# Patient Record
Sex: Male | Born: 1985
Health system: Southern US, Community
[De-identification: ages and names within clinical notes are randomized; demographics above are authoritative.]

---
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
## 2006-09-12 IMAGING — CR DG CHEST 2V
2 series · 2 of 2 positions shown · non-contrast
Comparison: None.

CLINICAL DATA: Cough with wheezing for several weeks. 
 CHEST, TWO VIEWS:

[view not recorded (1 of 2)]
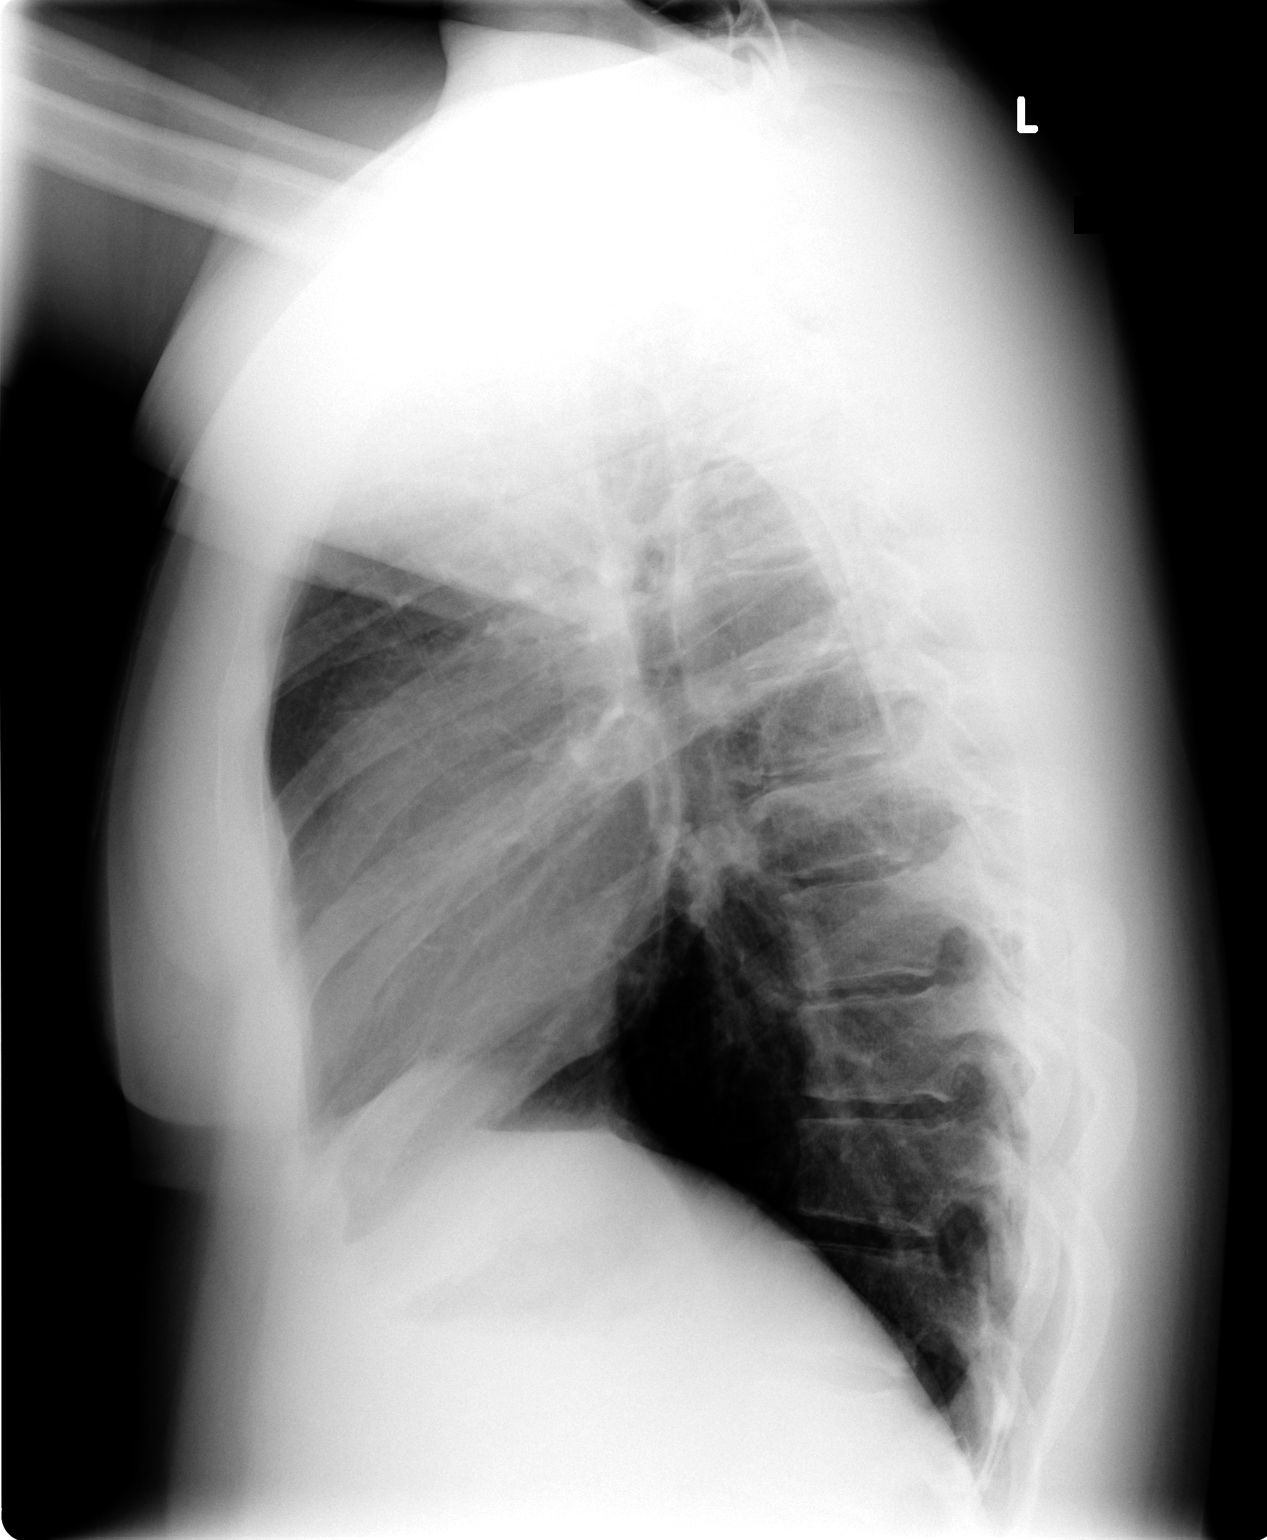

[view not recorded (2 of 2)]
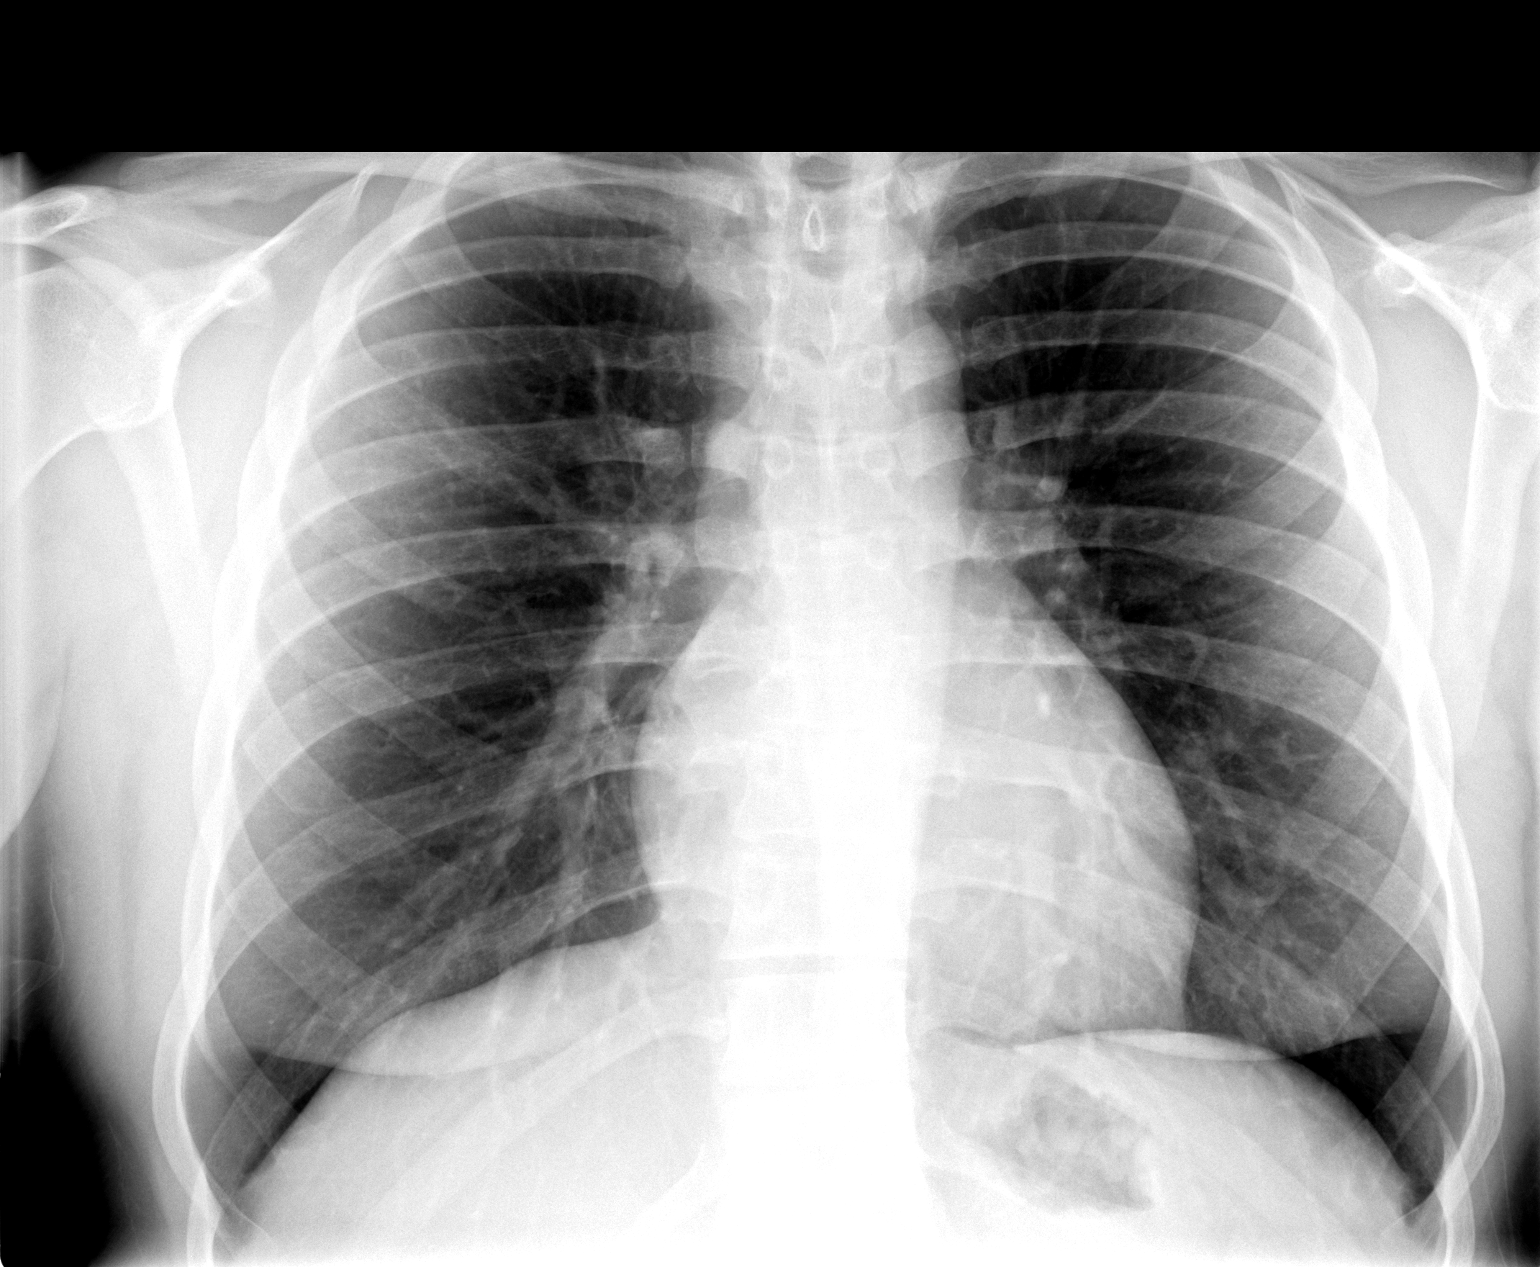

[2 of 2 positions shown; findings below may reference images not displayed]

Heart and vascularity normal.  The lungs are hyperaerated but clear.  No significant peribronchial thickening.
IMPRESSION: Lungs are hyperaerated but no active disease.

## 2010-07-17 ENCOUNTER — Ambulatory Visit: Payer: Self-pay | Admitting: Internal Medicine

## 2010-09-19 ENCOUNTER — Ambulatory Visit: Payer: Self-pay | Admitting: Internal Medicine

## 2011-08-31 ENCOUNTER — Encounter: Payer: Self-pay | Admitting: Internal Medicine

## 2011-08-31 ENCOUNTER — Ambulatory Visit (INDEPENDENT_AMBULATORY_CARE_PROVIDER_SITE_OTHER): Payer: BC Managed Care – PPO | Admitting: Internal Medicine

## 2011-08-31 VITALS — BP 124/80 | HR 68 | Temp 97.7°F | Ht 72.0 in | Wt 225.0 lb

## 2011-08-31 DIAGNOSIS — T7840XA Allergy, unspecified, initial encounter: Secondary | ICD-10-CM

## 2011-08-31 DIAGNOSIS — Z91040 Latex allergy status: Secondary | ICD-10-CM

## 2011-08-31 DIAGNOSIS — Z23 Encounter for immunization: Secondary | ICD-10-CM

## 2011-08-31 DIAGNOSIS — F988 Other specified behavioral and emotional disorders with onset usually occurring in childhood and adolescence: Secondary | ICD-10-CM

## 2011-08-31 NOTE — Patient Instructions (Signed)
Use EpiPen as directed if needed for latex allergy

## 2011-08-31 NOTE — Progress Notes (Signed)
  Subjective:    Patient ID: Alex Wells, male    DOB: 09-01-1986, 25 y.o.   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 Has a history of latex allergy and needs EpiPen refilled. Also has a history of attention deficit disorder. Not currently on attention deficit disorder medication. Also requests influenza immunization  which was given today.    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 Uses nitrile gloves now  Refill EpiPen when necessary one year. Influenza immunization given today

## 2011-09-01 DIAGNOSIS — Z23 Encounter for immunization: Secondary | ICD-10-CM

## 2012-09-05 ENCOUNTER — Encounter: Payer: Self-pay | Admitting: Internal Medicine

## 2012-09-05 ENCOUNTER — Ambulatory Visit (INDEPENDENT_AMBULATORY_CARE_PROVIDER_SITE_OTHER): Payer: Self-pay | Admitting: Internal Medicine

## 2012-09-05 VITALS — BP 126/70 | HR 80 | Temp 97.5°F | Wt 210.0 lb

## 2012-09-05 DIAGNOSIS — A088 Other specified intestinal infections: Secondary | ICD-10-CM

## 2012-09-05 DIAGNOSIS — A084 Viral intestinal infection, unspecified: Secondary | ICD-10-CM

## 2012-09-05 NOTE — Patient Instructions (Addendum)
Clear liquids until diarrhea has resolved for 24 hours then advance to soft diet. Stay out of work as long as you have diarrhea as you are likely contagious with viral gastroenteritis. Return as needed. Stay well hydrated.

## 2012-09-05 NOTE — Progress Notes (Signed)
  Subjective:    Patient ID: Alex Wells, male    DOB: 26-Dec-1985, 26 y.o.   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 No vomiting. Says he picked up a patient who had diarrhea and vomiting last week transport to [facility] He became ill the following day. His partner became ill a day or so later with similar illness. No travel history. Has not eaten anything unusual. No fever. No chills or myalgias.   Review of Systems     Objective:   Physical Exam abdominal exam no hepatosplenomegaly masses or tenderness        Assessment & Plan:  Probable viral gastroenteritis  Plan: Clear liquids until diarrhea resolves then advance diet slowly. Note given to be out of work. He tried Imodium but said it did not control symptoms. This is likely Norovirus.

## 2014-04-17 ENCOUNTER — Emergency Department (HOSPITAL_BASED_OUTPATIENT_CLINIC_OR_DEPARTMENT_OTHER)
Admission: EM | Admit: 2014-04-17 | Discharge: 2014-04-17 | Disposition: A | Discharge status: Home / Self Care | Payer: BC Managed Care – PPO | Attending: Emergency Medicine | Admitting: Emergency Medicine

## 2014-04-17 ENCOUNTER — Encounter (HOSPITAL_BASED_OUTPATIENT_CLINIC_OR_DEPARTMENT_OTHER): Payer: Self-pay | Admitting: Emergency Medicine

## 2014-04-17 DIAGNOSIS — S01501A Unspecified open wound of lip, initial encounter: Secondary | ICD-10-CM | POA: Insufficient documentation

## 2014-04-17 DIAGNOSIS — Y9389 Activity, other specified: Secondary | ICD-10-CM | POA: Insufficient documentation

## 2014-04-17 DIAGNOSIS — Y929 Unspecified place or not applicable: Secondary | ICD-10-CM | POA: Insufficient documentation

## 2014-04-17 DIAGNOSIS — S0180XA Unspecified open wound of other part of head, initial encounter: Secondary | ICD-10-CM | POA: Insufficient documentation

## 2014-04-17 DIAGNOSIS — F17200 Nicotine dependence, unspecified, uncomplicated: Secondary | ICD-10-CM | POA: Insufficient documentation

## 2014-04-17 DIAGNOSIS — S01511A Laceration without foreign body of lip, initial encounter: Secondary | ICD-10-CM

## 2014-04-17 DIAGNOSIS — S0181XA Laceration without foreign body of other part of head, initial encounter: Secondary | ICD-10-CM

## 2014-04-17 DIAGNOSIS — W108XXA Fall (on) (from) other stairs and steps, initial encounter: Secondary | ICD-10-CM | POA: Insufficient documentation

## 2014-04-17 DIAGNOSIS — Z91040 Latex allergy status: Secondary | ICD-10-CM | POA: Insufficient documentation

## 2014-04-17 MED ORDER — LIDOCAINE-EPINEPHRINE-TETRACAINE (LET) SOLUTION
3.0000 mL | Freq: Once | NASAL | Status: AC
  Administered 2014-04-17: 3 mL via TOPICAL
  Filled 2014-04-17: qty 3

## 2014-04-17 MED ORDER — CEPHALEXIN 250 MG PO CAPS
500.0000 mg | ORAL_CAPSULE | Freq: Once | ORAL | Status: AC
  Administered 2014-04-17: 500 mg via ORAL
  Filled 2014-04-17: qty 2

## 2014-04-17 MED ORDER — CEPHALEXIN 500 MG PO CAPS: 500.0000 mg | ORAL_CAPSULE | Freq: Four times a day (QID) | ORAL | Status: DC

## 2014-04-17 MED ORDER — OXYCODONE-ACETAMINOPHEN 5-325 MG PO TABS
1.0000 | ORAL_TABLET | Freq: Once | ORAL | Status: DC
  Filled 2014-04-17: qty 1

## 2014-04-17 NOTE — Discharge Instructions (Signed)
Facial Laceration  A facial laceration is a cut on the face. These injuries can be painful and cause bleeding. Lacerations usually heal quickly, but they need special care to reduce scarring. DIAGNOSIS  Your health care provider will take a medical history, ask for details about how the injury occurred, and examine the wound to determine how deep the cut is. TREATMENT  Some facial lacerations may not require closure. Others may not be able to be closed because of an increased risk of infection. The risk of infection and the chance for successful closure will depend on various factors, including the amount of time since the injury occurred. The wound may be cleaned to help prevent infection. If closure is appropriate, pain medicines may be given if needed. Your health care provider will use stitches (sutures), wound glue (adhesive), or skin adhesive strips to repair the laceration. These tools bring the skin edges together to allow for faster healing and a better cosmetic outcome. If needed, you may also be given a tetanus shot. HOME CARE INSTRUCTIONS  Only take over-the-counter or prescription medicines as directed by your health care provider.  Follow your health care provider's instructions for wound care. These instructions will vary depending on the technique used for closing the wound. For Sutures:  Keep the wound clean and dry.   If you were given a bandage (dressing), you should change it at least once a day. Also change the dressing if it becomes wet or dirty, or as directed by your health care provider.   Wash the wound with soap and water 2 times a day. Rinse the wound off with water to remove all soap. Pat the wound dry with a clean towel.   After cleaning, apply a thin layer of the antibiotic ointment recommended by your health care provider. This will help prevent infection and keep the dressing from sticking.   You may shower as usual after the first 24 hours. Do not soak the  wound in water until the sutures are removed.   Get your sutures removed as directed by your health care provider. With facial lacerations, sutures should usually be taken out after 4-5 days to avoid stitch marks.   Wait a few days after your sutures are removed before applying any makeup. For Skin Adhesive Strips:  Keep the wound clean and dry.   Do not get the skin adhesive strips wet. You may bathe carefully, using caution to keep the wound dry.   If the wound gets wet, pat it dry with a clean towel.   Skin adhesive strips will fall off on their own. You may trim the strips as the wound heals. Do not remove skin adhesive strips that are still stuck to the wound. They will fall off in time.  For Wound Adhesive:  You may briefly wet your wound in the shower or bath. Do not soak or scrub the wound. Do not swim. Avoid periods of heavy sweating until the skin adhesive has fallen off on its own. After showering or bathing, gently pat the wound dry with a clean towel.   Do not apply liquid medicine, cream medicine, ointment medicine, or makeup to your wound while the skin adhesive is in place. This may loosen the film before your wound is healed.   If a dressing is placed over the wound, be careful not to apply tape directly over the skin adhesive. This may cause the adhesive to be pulled off before the wound is healed.   Avoid   prolonged exposure to sunlight or tanning lamps while the skin adhesive is in place.  The skin adhesive will usually remain in place for 5-10 days, then naturally fall off the skin. Do not pick at the adhesive film.  After Healing: Once the wound has healed, cover the wound with sunscreen during the day for 1 full year. This can help minimize scarring. Exposure to ultraviolet light in the first year will darken the scar. It can take 1-2 years for the scar to lose its redness and to heal completely.  SEEK IMMEDIATE MEDICAL CARE IF:  You have redness, pain, or  swelling around the wound.   You see ayellowish-white fluid (pus) coming from the wound.   You have chills or a fever.  MAKE SURE YOU:  Understand these instructions.  Will watch your condition.  Will get help right away if you are not doing well or get worse. Document Released: 11/05/2004 Document Revised: 07/19/2013 Document Reviewed: 05/11/2013 ExitCare Patient Information 2015 [employer]. This information is not intended to replace advice given to you by your health care provider. Make sure you discuss any questions you have with your health care provider.  

## 2014-04-17 NOTE — ED Notes (Signed)
Pt reports fell going up wooden stairs.  Sustained laceration to lip and chin.

## 2014-04-17 NOTE — ED Provider Notes (Signed)
CSN: [id number]     Arrival date & time 04/17/14  0100 History   First MD Initiated Contact with Patient 04/17/14 0138     Chief Complaint  Patient presents with  . Facial Laceration     (Consider location/radiation/quality/duration/timing/severity/associated sxs/prior Treatment) Patient is a 28 y.o. male presenting with scalp laceration. The history is provided by the patient.  Head Laceration This is a new problem. The current episode started 1 to 2 hours ago. The problem occurs constantly. The problem has not changed since onset.Pertinent negatives include no chest pain, no abdominal pain, no headaches and no shortness of breath. Nothing aggravates the symptoms. Nothing relieves the symptoms. He has tried nothing for the symptoms. The treatment provided no relief.  Hit face on wooden steps.  No LOC no vomiting no neck pain  History reviewed. No pertinent past medical history. History reviewed. No pertinent past surgical history. No family history on file. History  Substance Use Topics  . Smoking status: Current Some Day Smoker    Types: Cigars  . Smokeless tobacco: Never Used  . Alcohol Use: Yes     Comment: occasional    Review of Systems  Eyes: Negative for visual disturbance.  Respiratory: Negative for shortness of breath.   Cardiovascular: Negative for chest pain.  Gastrointestinal: Negative for abdominal pain.  Neurological: Negative for weakness, numbness and headaches.  All other systems reviewed and are negative.     Allergies  Latex  Home Medications   Prior to Admission medications   Medication Sig Start Date End Date Taking? Authorizing Provider  cephALEXin (KEFLEX) 500 MG capsule Take 1 capsule (500 mg total) by mouth 4 (four) times daily. 04/17/14   Aaryana Betke K Daniesha Driver-Rasch, MD  EPINEPHrine (EPI-PEN) 0.3 mg/0.3 mL DEVI Inject 0.3 mg into the muscle once.      Historical Provider, MD   BP 132/68  Pulse 90  Temp(Src) 98.5 F (36.9 C) (Oral)  Resp 16  Ht 6'  (1.829 m)  Wt 230 lb (104.327 kg)  BMI 31.19 kg/m2  SpO2 98% Physical Exam  Constitutional: He is oriented to person, place, and time. He appears well-developed and well-nourished.  HENT:  Head: Normocephalic. Head is without raccoon's eyes and without Battle's sign.    Right Ear: No mastoid tenderness. No hemotympanum.  Left Ear: No mastoid tenderness. No hemotympanum.  Mouth/Throat: Oropharynx is clear and moist.  Eyes: Conjunctivae are normal. Pupils are equal, round, and reactive to light.  Neck: Normal range of motion. Neck supple.  No c spine tenderness  Cardiovascular: Normal rate, regular rhythm and intact distal pulses.   Pulmonary/Chest: Effort normal and breath sounds normal. He has no wheezes. He has no rales.  Abdominal: Soft. Bowel sounds are normal. There is no tenderness. There is no rebound and no guarding.  Musculoskeletal: Normal range of motion.  Neurological: He is alert and oriented to person, place, and time.  Skin: Skin is warm and dry.  Psychiatric: He has a normal mood and affect.    ED Course  Procedures (including critical care time) Labs Review Labs Reviewed - No data to display  Imaging Review No results found.   EKG Interpretation None      MDM   Final diagnoses:  Facial laceration, initial encounter  Chin laceration, initial encounter  Lip laceration, initial encounter    LACERATION REPAIR Performed by: Jasmine AwePALUMBO-RASCH,Kathline Banbury K Authorized by: Jasmine AwePALUMBO-RASCH,Mate Alegria K Consent: Verbal consent obtained. Risks and benefits: risks, benefits and alternatives were discussed Consent given by: patient  Patient identity confirmed: provided demographic data Prepped and Draped in normal sterile fashion Wound explored  Laceration Location: right lower lip through vermillion board Laceration Length: 1.5 cm  No Foreign Bodies seen or palpated  Anesthesia: local infiltration  Local anesthetic: lidocaine 1%   Anesthetic total: 2  ml  Irrigation method: syringe Amount of cleaning: extensive   Skin closure: 6.0 ethilon   Number of sutures: 5  Technique: interrupted complex to approximate vermillion border  Patient tolerance: Patient tolerated the procedure well with no immediate complications.   /LACERATION REPAIR Performed by: Jasmine AwePALUMBO-RASCH,Foy Mungia K Authorized by: Jasmine AwePALUMBO-RASCH,Frederico Gerling K Consent: Verbal consent obtained. Risks and benefits: risks, benefits and alternatives were discussed Consent given by: patient Patient identity confirmed: provided demographic data Prepped and Draped in normal sterile fashion Wound explored  Laceration Location: left lower lip  Laceration Length: 1 cm  No Foreign Bodies seen or palpated  Anesthesia: local infiltration  Local anesthetic: lidocaine 1%   Anesthetic total: 1 ml  Irrigation method: syringe Amount of cleaning: extensive  Skin closure: 6 ethilon  Number of sutures: 3  Technique: interrupted  Patient tolerance: Patient tolerated the procedure well with no immediate complications.   LACERATION REPAIR Performed by: Jasmine AwePALUMBO-RASCH,Nihal Marzella K Authorized by: Jasmine AwePALUMBO-RASCH,Tenicia Gural K Consent: Verbal consent obtained. Risks and benefits: risks, benefits and alternatives were discussed Consent given by: patient Patient identity confirmed: provided demographic data Prepped and Draped in normal sterile fashion Wound explored  Laceration Location: left jaw bone, mid length  Laceration Length: 2 cm  No Foreign Bodies seen or palpated  Anesthesia: local infiltration  Local anesthetic: lidocaine 1%  Anesthetic total: 2 ml  Irrigation method: syringe Amount of cleaning: standard  Skin closure: 5.0 ethilon  Number of sutures: 5  Technique: interrupted complex repair  Patient tolerance: Patient tolerated the procedure well with no immediate complications.  Suture removal 5-6 days at urgent care.  Will give ENT follow up due h/o keloids.   Antibiotics ordered.  Patient declines pain medication  Doubt head injury.  No vomiting no LOC   Kresta Templeman K Malakhi Markwood-Rasch, MD 04/17/14 [id number]

## 2015-05-30 ENCOUNTER — Encounter: Payer: Self-pay | Admitting: Internal Medicine

## 2015-05-30 ENCOUNTER — Ambulatory Visit (INDEPENDENT_AMBULATORY_CARE_PROVIDER_SITE_OTHER): Payer: BLUE CROSS/BLUE SHIELD | Admitting: Internal Medicine

## 2015-05-30 VITALS — BP 128/82 | HR 67 | Temp 98.0°F | Wt 236.5 lb

## 2015-05-30 DIAGNOSIS — Z23 Encounter for immunization: Secondary | ICD-10-CM

## 2015-05-30 DIAGNOSIS — I889 Nonspecific lymphadenitis, unspecified: Secondary | ICD-10-CM

## 2015-05-30 DIAGNOSIS — Z0000 Encounter for general adult medical examination without abnormal findings: Secondary | ICD-10-CM | POA: Diagnosis not present

## 2015-06-10 NOTE — Progress Notes (Signed)
   Subjective:    Patient ID: Alex Wells, male    DOB: 07/31/1986, 29 y.o.   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 He noticed a small lump in the right occipital area recently. It seems to have decreased in size he thinks. He had no illness associated with this in terms of sore throat or respiratory infection. No ear infection. No injury to neck area. Says it was tender.    Review of Systems     Objective:   Physical Exam  Small mobile nodule about the size of a dime that is nontender right occipital area. No other adenopathy appreciated. No erythema.      Assessment & Plan:  Resolving reactive lymph node occipital area  Plan: Reassure patient. If lymph node increases in size he is to left be noted. Otherwise I think it will resolve

## 2015-06-11 ENCOUNTER — Encounter: Payer: Self-pay | Admitting: Internal Medicine

## 2015-06-11 NOTE — Patient Instructions (Addendum)
Likely have reactive lymph node which will resolve. Return as needed. Tdap Update given.

## 2016-11-13 ENCOUNTER — Telehealth: Payer: Self-pay | Admitting: Internal Medicine

## 2016-11-13 NOTE — Telephone Encounter (Signed)
Order for patient to take EpiPen to [place] written on prescription pad

## 2016-11-13 NOTE — Telephone Encounter (Signed)
Patient is going to be traveling to [place] in May.  Advised that he'll have to get vaccinated either at [facility] or the local Health Department.  He was asking about Typhoid, etc.  He needs a Rx that reads that he can carry an epinephrine pen legally.  States that he has to legally prove that he can have that in the country to carry his Epipen with him.   Best # for contact:  [phone]

## 2016-11-13 NOTE — Telephone Encounter (Signed)
Advised patient this is ready for him to pick up at his convenience.

## 2017-04-08 ENCOUNTER — Telehealth: Payer: Self-pay

## 2017-04-08 NOTE — Telephone Encounter (Signed)
Pt called and stated that he has the stomach bug and had to miss work because of it. Says that he does not need to be seen in the office but would like a doctors note. Spoke with Dr. Lenord FellersBaxley and she says that a note can not be written without seeing the patient. The patient is aware

## 2017-09-30 ENCOUNTER — Other Ambulatory Visit: Payer: Self-pay | Admitting: Internal Medicine

## 2017-09-30 DIAGNOSIS — Z0000 Encounter for general adult medical examination without abnormal findings: Secondary | ICD-10-CM

## 2017-09-30 DIAGNOSIS — Z13220 Encounter for screening for lipoid disorders: Secondary | ICD-10-CM

## 2017-10-14 ENCOUNTER — Other Ambulatory Visit: Payer: BLUE CROSS/BLUE SHIELD | Admitting: Internal Medicine

## 2017-10-14 DIAGNOSIS — Z13220 Encounter for screening for lipoid disorders: Secondary | ICD-10-CM

## 2017-10-14 DIAGNOSIS — Z0000 Encounter for general adult medical examination without abnormal findings: Secondary | ICD-10-CM

## 2017-10-14 LAB — LIPID PANEL
Cholesterol: 242 mg/dL — ABNORMAL HIGH (ref <200)
HDL: 77 mg/dL (ref >40)
LDL Cholesterol (Calc): 148 mg/dL (calc) — ABNORMAL HIGH
Non-HDL Cholesterol (Calc): 165 mg/dL (calc) — ABNORMAL HIGH (ref <130)
Total CHOL/HDL Ratio: 3.1 (calc) (ref <5.0)
Triglycerides: 71 mg/dL (ref <150)

## 2017-10-14 LAB — COMPLETE METABOLIC PANEL WITH GFR
AG Ratio: 2.5 (calc) (ref 1.0–2.5)
ALT: 27 U/L (ref 9–46)
AST: 17 U/L (ref 10–40)
Albumin: 4.7 g/dL (ref 3.6–5.1)
Alkaline phosphatase (APISO): 37 U/L — ABNORMAL LOW (ref 40–115)
BUN: 16 mg/dL (ref 7–25)
CO2: 28 mmol/L (ref 20–32)
Calcium: 9.4 mg/dL (ref 8.6–10.3)
Chloride: 102 mmol/L (ref 98–110)
Creat: 0.8 mg/dL (ref 0.60–1.35)
GFR, Est African American: 139 mL/min/{1.73_m2} (ref >=60)
GFR, Est Non African American: 120 mL/min/{1.73_m2} (ref >=60)
Globulin: 1.9 g/dL (calc) (ref 1.9–3.7)
Glucose, Bld: 92 mg/dL (ref 65–99)
Potassium: 4.1 mmol/L (ref 3.5–5.3)
Sodium: 137 mmol/L (ref 135–146)
Total Bilirubin: 0.5 mg/dL (ref 0.2–1.2)
Total Protein: 6.6 g/dL (ref 6.1–8.1)

## 2017-10-14 LAB — CBC WITH DIFFERENTIAL/PLATELET
Basophils Absolute: 22 cells/uL (ref 0–200)
Basophils Relative: 0.4 %
Eosinophils Absolute: 200 cells/uL (ref 15–500)
Eosinophils Relative: 3.7 %
HCT: 43.4 % (ref 38.5–50.0)
Hemoglobin: 15 g/dL (ref 13.2–17.1)
Lymphs Abs: 2128 cells/uL (ref 850–3900)
MCH: 30.7 pg (ref 27.0–33.0)
MCHC: 34.6 g/dL (ref 32.0–36.0)
MCV: 88.8 fL (ref 80.0–100.0)
MPV: 9.4 fL (ref 7.5–12.5)
Monocytes Relative: 7.4 %
Neutro Abs: 2651 cells/uL (ref 1500–7800)
Neutrophils Relative %: 49.1 %
Platelets: 193 10*3/uL (ref 140–400)
RBC: 4.89 10*6/uL (ref 4.20–5.80)
RDW: 12.3 % (ref 11.0–15.0)
Total Lymphocyte: 39.4 %
WBC mixed population: 400 cells/uL (ref 200–950)
WBC: 5.4 10*3/uL (ref 3.8–10.8)

## 2017-10-15 ENCOUNTER — Ambulatory Visit (INDEPENDENT_AMBULATORY_CARE_PROVIDER_SITE_OTHER): Payer: BLUE CROSS/BLUE SHIELD | Admitting: Internal Medicine

## 2017-10-15 ENCOUNTER — Encounter: Payer: Self-pay | Admitting: Internal Medicine

## 2017-10-15 VITALS — BP 122/80 | HR 86 | Ht 72.0 in | Wt 219.0 lb

## 2017-10-15 DIAGNOSIS — E7800 Pure hypercholesterolemia, unspecified: Secondary | ICD-10-CM | POA: Diagnosis not present

## 2017-10-15 DIAGNOSIS — Z0000 Encounter for general adult medical examination without abnormal findings: Secondary | ICD-10-CM | POA: Diagnosis not present

## 2017-10-15 DIAGNOSIS — Z789 Other specified health status: Secondary | ICD-10-CM | POA: Diagnosis not present

## 2017-10-15 LAB — POCT URINALYSIS DIPSTICK
Appearance: NORMAL
Bilirubin, UA: NEGATIVE
Blood, UA: NEGATIVE
Glucose, UA: NEGATIVE
Ketones, UA: NEGATIVE
Leukocytes, UA: NEGATIVE
Nitrite, UA: NEGATIVE
Odor: NORMAL
Protein, UA: NEGATIVE
Spec Grav, UA: 1.015 (ref 1.010–1.025)
Urobilinogen, UA: 0.2 E.U./dL
pH, UA: 6.5 (ref 5.0–8.0)

## 2017-10-18 LAB — VARICELLA ZOSTER ANTIBODY, IGG: Varicella IgG: 1708 index

## 2017-10-28 ENCOUNTER — Other Ambulatory Visit: Payer: BLUE CROSS/BLUE SHIELD | Admitting: Internal Medicine

## 2017-10-28 DIAGNOSIS — Z111 Encounter for screening for respiratory tuberculosis: Secondary | ICD-10-CM

## 2017-10-28 DIAGNOSIS — Z1159 Encounter for screening for other viral diseases: Secondary | ICD-10-CM

## 2017-10-29 LAB — HEPATITIS B SURFACE ANTIBODY, QUANTITATIVE: Hep B S AB Quant (Post): 40 m[IU]/mL (ref >=10)

## 2017-10-30 NOTE — Patient Instructions (Signed)
It was a pleasure to see you today.  Work on exercise and consuming low-fat foods.  Return in 3-6 months for fasting lipid panel.  May need additional labs drawn for QuantiFERON testing and hepatitis B surface antibody.  Varicella titer is positive.

## 2017-10-30 NOTE — Progress Notes (Signed)
   Subjective:    Patient ID: Alex Wells, male    DOB: 1986/05/18, 32 y.o.   MRN: [id number]  HPI 32 year old Male for physical exam to enter [employer] in [place] later this year.  He continues to work with [employer].  His general health is good.  Immunizations need to be verified by blood work.  He needs to check and see if he has had a recent TB skin test or Hepatitis B titer.  CBC and C met are normal.  Urine dipstick is normal.  Varicella titer  is positive.  However total cholesterol was 242 and LDL cholesterol is 148.  HDL is 77 and triglycerides are normal at 71.  Needs to work on diet and exercise and follow-up in 3-6 months.  Social history: He is single.  Non-smoker.  Social alcohol consumption.  Family history: Mother living.  Father died of sudden cardiac arrest.  No siblings    Review of Systems  Constitutional: Negative.   Respiratory: Negative.   Cardiovascular: Negative.   Gastrointestinal: Negative.   Musculoskeletal: Negative.   Psychiatric/Behavioral: Negative.        Objective:   Physical Exam  Constitutional: He is oriented to person, place, and time. He appears well-developed and well-nourished. No distress.  HENT:  Head: Normocephalic and atraumatic.  Right Ear: External ear normal.  Left Ear: External ear normal.  Mouth/Throat: Oropharynx is clear and moist.  Eyes: Conjunctivae and EOM are normal. Pupils are equal, round, and reactive to light. Right eye exhibits no discharge. Left eye exhibits no discharge. No scleral icterus.  Neck: Neck supple. No JVD present.  Cardiovascular: Normal rate, regular rhythm and normal heart sounds.  No murmur heard. Pulmonary/Chest: No respiratory distress. He has no wheezes. He has no rales.  Abdominal: Soft. Bowel sounds are normal. He exhibits no distension and no mass. There is no tenderness. There is no rebound and no guarding.  Genitourinary:  Genitourinary Comments: No hernias to  direct palpation  Musculoskeletal: He exhibits no edema.  Lymphadenopathy:    He has no cervical adenopathy.  Neurological: He is alert and oriented to person, place, and time. He has normal reflexes. No cranial nerve deficit. Coordination normal.  Skin: Skin is warm and dry. No rash noted. He is not diaphoretic.  Psychiatric: He has a normal mood and affect. His behavior is normal. Judgment and thought content normal.  Vitals reviewed.         Assessment & Plan:  Normal health maintenance exam  Pure hypercholesterolemia  Plan: Trial of diet and exercise for 3-6 months with repeat lipid panel at that time.  Varicella titer is positive.  May need QuantiFERON TB testing and hepatitis B surface antibody testing.  He will check with EMS to see if these have been done previously.  Form completed for entrance to [employer] school in [place]

## 2017-11-02 NOTE — Progress Notes (Signed)
Has to be recollected per lab

## 2017-11-04 ENCOUNTER — Other Ambulatory Visit: Payer: BLUE CROSS/BLUE SHIELD | Admitting: Internal Medicine

## 2017-11-04 DIAGNOSIS — Z111 Encounter for screening for respiratory tuberculosis: Secondary | ICD-10-CM

## 2017-11-06 LAB — QUANTIFERON-TB GOLD PLUS
Mitogen-NIL: >10 IU/mL
NIL: 0.04 IU/mL
QuantiFERON-TB Gold Plus: NEGATIVE
TB1-NIL: 0 IU/mL
TB2-NIL: 0 IU/mL

## 2018-01-25 ENCOUNTER — Telehealth: Payer: Self-pay | Admitting: Internal Medicine

## 2018-01-25 NOTE — Telephone Encounter (Signed)
Alex Wells Self [phone]  Dian Situ called to say that he needed titers for Measles,Mumps and rubella. Spoke with Dr Renold Genta, she said call him in for MMR lab visit

## 2018-01-25 NOTE — Telephone Encounter (Signed)
LVM to CB and schedule MMR Lab appointment

## 2018-02-10 ENCOUNTER — Other Ambulatory Visit: Payer: BLUE CROSS/BLUE SHIELD | Admitting: Internal Medicine

## 2018-02-10 DIAGNOSIS — Z789 Other specified health status: Secondary | ICD-10-CM

## 2018-02-11 LAB — MEASLES/MUMPS/RUBELLA IMMUNITY
Mumps IgG: 16 AU/mL
Rubella: 1.53 index
Rubeola IgG: <25 AU/mL — ABNORMAL LOW

## 2018-02-15 ENCOUNTER — Telehealth: Payer: Self-pay | Admitting: Internal Medicine

## 2018-02-15 ENCOUNTER — Ambulatory Visit (INDEPENDENT_AMBULATORY_CARE_PROVIDER_SITE_OTHER): Payer: BLUE CROSS/BLUE SHIELD | Admitting: Internal Medicine

## 2018-02-15 VITALS — BP 120/80 | HR 78 | Temp 98.3°F

## 2018-02-15 DIAGNOSIS — Z23 Encounter for immunization: Secondary | ICD-10-CM

## 2018-02-15 NOTE — Patient Instructions (Addendum)
Patient received MMR injection on the L arm SQ.  Patient to return in  4- 6 weeks for follow up Rubeola titer.

## 2018-02-15 NOTE — Telephone Encounter (Addendum)
error 

## 2018-02-15 NOTE — Telephone Encounter (Signed)
LVM to CB and schedule appointment to come in for MMR injection

## 2018-02-15 NOTE — Telephone Encounter (Signed)
Drew called back and scheduled appointment for today

## 2018-02-15 NOTE — Progress Notes (Signed)
   Subjective:    Patient ID: Alex Wells, male    DOB: Apr 08, 1986, 32 y.o.   MRN: [id number]  HPI MMR given Needs follow up Rubeola titer in 4-6 weeks. He has sub therapeutic titer to Rubeola but adequate titers to [place] and Mumps. 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    Review of Systems     Objective:   Physical Exam        Assessment & Plan:  Need for MMR due to inadequate titer to Rubeola. MMR given

## 2018-03-08 ENCOUNTER — Other Ambulatory Visit: Payer: Self-pay | Admitting: Internal Medicine

## 2018-03-08 DIAGNOSIS — E7800 Pure hypercholesterolemia, unspecified: Secondary | ICD-10-CM

## 2018-03-17 ENCOUNTER — Other Ambulatory Visit: Payer: BLUE CROSS/BLUE SHIELD | Admitting: Internal Medicine

## 2018-03-17 DIAGNOSIS — E7800 Pure hypercholesterolemia, unspecified: Secondary | ICD-10-CM

## 2018-03-17 DIAGNOSIS — Z1159 Encounter for screening for other viral diseases: Secondary | ICD-10-CM

## 2018-03-17 LAB — LIPID PANEL
Cholesterol: 187 mg/dL (ref <200)
HDL: 61 mg/dL (ref >40)
LDL Cholesterol (Calc): 106 mg/dL (calc) — ABNORMAL HIGH
Non-HDL Cholesterol (Calc): 126 mg/dL (calc) (ref <130)
Total CHOL/HDL Ratio: 3.1 (calc) (ref <5.0)
Triglycerides: 104 mg/dL (ref <150)

## 2018-03-17 NOTE — Addendum Note (Signed)
Addended by: Gregery NaVALENCIA, Terrian Sentell P on: 03/17/2018 10:53 AM   Modules accepted: Orders

## 2018-03-18 ENCOUNTER — Ambulatory Visit (INDEPENDENT_AMBULATORY_CARE_PROVIDER_SITE_OTHER): Payer: BLUE CROSS/BLUE SHIELD | Admitting: Internal Medicine

## 2018-03-18 ENCOUNTER — Encounter: Payer: Self-pay | Admitting: Internal Medicine

## 2018-03-18 VITALS — BP 102/70 | HR 76 | Ht 72.0 in | Wt 228.0 lb

## 2018-03-18 DIAGNOSIS — E7800 Pure hypercholesterolemia, unspecified: Secondary | ICD-10-CM

## 2018-03-18 LAB — MEASLES/MUMPS/RUBELLA IMMUNITY
Mumps IgG: 22 AU/mL
Rubella: 2.79 index
Rubeola IgG: <25 AU/mL — ABNORMAL LOW

## 2018-03-18 NOTE — Progress Notes (Signed)
   Subjective:    Patient ID: Alex Wells, male    DOB: 1986/05/15, 32 y.o.   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 Works now as a Audiological scientist.  recently given MMR vaccine due to low Rubeola titer.  Repeat titer drawn yesterday and is still pending.  He had a physical exam  several months ago and was found to have total cholesterol of 242, triglycerides of 71, HDL cholesterol 77, LDL cholesterol 148.  He is been working on diet and exercise.  Recent lipid panel showed total cholesterol of 187, HDL cholesterol 61, triglycerides 104, LDL cholesterol 106.  This is a considerable improvement just with diet and exercise and I am pleased with that.    Review of Systems     Objective:   Physical Exam  Not examined today but spent 15 minutes discussing these issues with him.      Assessment & Plan:  Rubeola titer pending status post recent MMR vaccine in May  Improvement in hyperlipidemia-continue diet and exercise  Plan: Return in 1 year or as needed.

## 2018-03-18 NOTE — Patient Instructions (Signed)
Rubeola titer pending.  I am pleased with improvement in lipids.  Congratulations on acceptance to medical school.  Return in 1 year or as needed.

## 2018-03-22 ENCOUNTER — Ambulatory Visit (INDEPENDENT_AMBULATORY_CARE_PROVIDER_SITE_OTHER): Payer: BLUE CROSS/BLUE SHIELD | Admitting: Internal Medicine

## 2018-03-22 ENCOUNTER — Ambulatory Visit: Payer: BLUE CROSS/BLUE SHIELD | Admitting: Internal Medicine

## 2018-03-22 VITALS — BP 102/70 | HR 64 | Temp 98.1°F | Ht 72.0 in | Wt 219.0 lb

## 2018-03-22 DIAGNOSIS — Z23 Encounter for immunization: Secondary | ICD-10-CM

## 2018-03-22 NOTE — Progress Notes (Signed)
Repeat MMR as pt still does not have immunity to Rubeola. Recheck titer in 4 weeks to Rubeola

## 2018-03-22 NOTE — Patient Instructions (Signed)
Patient received an MMR vaccine on the left arm SQ.

## 2019-11-10 ENCOUNTER — Telehealth: Payer: Self-pay | Admitting: Internal Medicine

## 2019-11-10 NOTE — Telephone Encounter (Signed)
Alex Wells [phone]  Alex Wells called to see if there was somewhere in his file that he had been treated or diagnosed with ADD. 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

## 2019-11-10 NOTE — Telephone Encounter (Signed)
When looking in old chart I see where Ritalin was written for ADD on 08/10/2003. That is only thing I found. Would we need to set up virtual visit to discuss?

## 2019-11-15 NOTE — Telephone Encounter (Signed)
Scheduled virtual visit °

## 2019-11-16 ENCOUNTER — Ambulatory Visit (INDEPENDENT_AMBULATORY_CARE_PROVIDER_SITE_OTHER): Payer: Managed Care, Other (non HMO) | Admitting: Internal Medicine

## 2019-11-16 ENCOUNTER — Other Ambulatory Visit: Payer: Self-pay

## 2019-11-16 ENCOUNTER — Encounter: Payer: Self-pay | Admitting: Internal Medicine

## 2019-11-16 VITALS — BP 136/88 | Ht 72.0 in | Wt 250.0 lb

## 2019-11-16 DIAGNOSIS — F988 Other specified behavioral and emotional disorders with onset usually occurring in childhood and adolescence: Secondary | ICD-10-CM | POA: Diagnosis not present

## 2019-11-16 NOTE — Progress Notes (Signed)
   Subjective:    Patient ID: Alex Wells, male    DOB: 02-Jul-1986, 34 y.o.   MRN: [id number]  HPI  34 year old Male attending [address] [employer] and is in his second year there.  Patient was evaluated by Developmental Pediatrician, Dr. Delton Prairie here in [place] around the time he was in the second grade. Dr. Jane Canary has been retired for several years, and I do not know where those records are. He did take medication for ADD up until about the 7th grade he says. Then he decided to stop taking it and although he knew he had ADD was able to function successfully without medication. 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 The shear amount of material presented and the amount of hours required studying has led him to believe he could benefit from ADD medication once again. I do have in his paper records we prescribed Ritalin for him once in 2004.  He may need some documentation of this history if he decides to pursue further help with ADD. I am happy to write a letter to support diagnosis.  He looks well.He was seen today by [employer] due to coronavirus pandemic.  He is agreeable to visit in this format today.  He is identified using 2 identifiers as Alex Wells, a patient in this practice.  Seen from his residence in [place].    Review of Systems     Objective:   Physical Exam Reports that he is doing well in school and passing subjects in his second year.  Plans to move to [place], [place] to begin his third year this coming Spring.  Having issues with focus at times.  The sheer amount of material presented  is at times overwhelming.  Finds that he can be easily distracted.  Certainly his prior history and his current situation of easy distractibility with large volumes of material are consistent with attention deficit disorder.  He does not appear to be hyperactive.       Assessment &  Plan:  Attention deficit disorder- hypoactive style  Plan: I think he would benefit from attention deficit medication.  He will let me know what further documentation is necessary for me if necessary.  I cannot prescribe across state lines and he may need to see a physician there regarding this issue.  He may be eligible for some extra help with studying/tutoring.

## 2019-11-16 NOTE — Patient Instructions (Signed)
It is not lengthening does have attention deficit disorder-hypoactive style and could benefit from medication and assistance with studying.

## 2021-11-20 ENCOUNTER — Telehealth: Payer: Self-pay | Admitting: Internal Medicine

## 2021-11-20 NOTE — Telephone Encounter (Signed)
Alex Wells [phone]  [name] called to say he needs to get a quatinferin Gold TB test for his rotation at [employer] [facility] [employer] in April, he wanted to know if he could come here for that?

## 2021-11-20 NOTE — Telephone Encounter (Signed)
scheduled

## 2021-11-21 ENCOUNTER — Other Ambulatory Visit: Payer: Managed Care, Other (non HMO) | Admitting: Internal Medicine

## 2021-11-21 ENCOUNTER — Other Ambulatory Visit: Payer: Self-pay

## 2021-11-21 DIAGNOSIS — Z111 Encounter for screening for respiratory tuberculosis: Secondary | ICD-10-CM

## 2021-11-25 LAB — QUANTIFERON-TB GOLD PLUS

## 2021-12-01 ENCOUNTER — Other Ambulatory Visit: Payer: Managed Care, Other (non HMO) | Admitting: Internal Medicine

## 2021-12-01 ENCOUNTER — Other Ambulatory Visit: Payer: Self-pay

## 2021-12-01 DIAGNOSIS — Z111 Encounter for screening for respiratory tuberculosis: Secondary | ICD-10-CM

## 2021-12-04 LAB — QUANTIFERON-TB GOLD PLUS
Mitogen-NIL: >10 IU/mL
NIL: 0.03 IU/mL
QuantiFERON-TB Gold Plus: NEGATIVE
TB1-NIL: 0.01 IU/mL
TB2-NIL: 0 IU/mL
